# Patient Record
Sex: Female | Born: 1980 | Race: White | Hispanic: No | Marital: Married | Smoking: Current every day smoker
Health system: Southern US, Community
[De-identification: ages and names within clinical notes are randomized; demographics above are authoritative.]

## PROBLEM LIST (undated history)

## (undated) DIAGNOSIS — IMO0002 Reserved for concepts with insufficient information to code with codable children: Secondary | ICD-10-CM

## (undated) HISTORY — DX: Reserved for concepts with insufficient information to code with codable children: IMO0002

## (undated) HISTORY — PX: TONSILLECTOMY AND ADENOIDECTOMY: SHX28

---

## 1999-10-23 ENCOUNTER — Emergency Department (HOSPITAL_COMMUNITY): Admission: EM | Admit: 1999-10-23 | Discharge: 1999-10-23 | Payer: Self-pay | Admitting: Emergency Medicine

## 1999-10-23 ENCOUNTER — Encounter: Payer: Self-pay | Admitting: Emergency Medicine

## 2000-05-16 DIAGNOSIS — IMO0002 Reserved for concepts with insufficient information to code with codable children: Secondary | ICD-10-CM

## 2000-05-16 DIAGNOSIS — R87619 Unspecified abnormal cytological findings in specimens from cervix uteri: Secondary | ICD-10-CM

## 2000-05-16 HISTORY — DX: Unspecified abnormal cytological findings in specimens from cervix uteri: R87.619

## 2000-05-16 HISTORY — DX: Reserved for concepts with insufficient information to code with codable children: IMO0002

## 2000-05-16 HISTORY — PX: VULVA SURGERY: SHX837

## 2000-05-16 HISTORY — PX: COLPOSCOPY: SHX161

## 2000-09-26 ENCOUNTER — Other Ambulatory Visit: Admission: RE | Admit: 2000-09-26 | Discharge: 2000-09-26 | Payer: Self-pay | Admitting: Obstetrics and Gynecology

## 2000-09-26 ENCOUNTER — Encounter (INDEPENDENT_AMBULATORY_CARE_PROVIDER_SITE_OTHER): Payer: Self-pay | Admitting: Specialist

## 2000-10-20 ENCOUNTER — Other Ambulatory Visit: Admission: RE | Admit: 2000-10-20 | Discharge: 2000-10-20 | Payer: Self-pay | Admitting: Obstetrics and Gynecology

## 2000-10-23 ENCOUNTER — Encounter (INDEPENDENT_AMBULATORY_CARE_PROVIDER_SITE_OTHER): Payer: Self-pay | Admitting: Specialist

## 2000-10-23 ENCOUNTER — Other Ambulatory Visit: Admission: RE | Admit: 2000-10-23 | Discharge: 2000-10-23 | Payer: Self-pay | Admitting: Obstetrics and Gynecology

## 2000-11-29 ENCOUNTER — Ambulatory Visit (HOSPITAL_COMMUNITY): Admission: RE | Admit: 2000-11-29 | Discharge: 2000-11-29 | Payer: Self-pay | Admitting: Obstetrics and Gynecology

## 2000-11-29 ENCOUNTER — Encounter (INDEPENDENT_AMBULATORY_CARE_PROVIDER_SITE_OTHER): Payer: Self-pay

## 2001-03-13 ENCOUNTER — Ambulatory Visit: Admission: RE | Admit: 2001-03-13 | Discharge: 2001-03-13 | Payer: Self-pay | Admitting: Gynecology

## 2001-09-28 ENCOUNTER — Other Ambulatory Visit: Admission: RE | Admit: 2001-09-28 | Discharge: 2001-09-28 | Payer: Self-pay | Admitting: Obstetrics and Gynecology

## 2002-09-30 ENCOUNTER — Other Ambulatory Visit: Admission: RE | Admit: 2002-09-30 | Discharge: 2002-09-30 | Payer: Self-pay | Admitting: Obstetrics and Gynecology

## 2003-09-19 ENCOUNTER — Other Ambulatory Visit: Admission: RE | Admit: 2003-09-19 | Discharge: 2003-09-19 | Payer: Self-pay | Admitting: Obstetrics and Gynecology

## 2004-11-12 ENCOUNTER — Other Ambulatory Visit: Admission: RE | Admit: 2004-11-12 | Discharge: 2004-11-12 | Payer: Self-pay | Admitting: Obstetrics and Gynecology

## 2004-11-15 ENCOUNTER — Other Ambulatory Visit: Admission: RE | Admit: 2004-11-15 | Discharge: 2004-11-15 | Payer: Self-pay | Admitting: Obstetrics and Gynecology

## 2005-04-04 ENCOUNTER — Other Ambulatory Visit: Admission: RE | Admit: 2005-04-04 | Discharge: 2005-04-04 | Payer: Self-pay | Admitting: Obstetrics and Gynecology

## 2005-04-05 ENCOUNTER — Other Ambulatory Visit: Admission: RE | Admit: 2005-04-05 | Discharge: 2005-04-05 | Payer: Self-pay | Admitting: Obstetrics and Gynecology

## 2005-11-10 ENCOUNTER — Inpatient Hospital Stay (HOSPITAL_COMMUNITY): Admission: AD | Admit: 2005-11-10 | Discharge: 2005-11-12 | Payer: Self-pay | Admitting: Obstetrics and Gynecology

## 2008-08-09 ENCOUNTER — Inpatient Hospital Stay (HOSPITAL_COMMUNITY): Admission: AD | Admit: 2008-08-09 | Discharge: 2008-08-11 | Payer: Self-pay | Admitting: Obstetrics & Gynecology

## 2008-08-09 ENCOUNTER — Inpatient Hospital Stay (HOSPITAL_COMMUNITY): Admission: AD | Admit: 2008-08-09 | Discharge: 2008-08-09 | Payer: Self-pay | Admitting: Obstetrics & Gynecology

## 2008-09-02 ENCOUNTER — Ambulatory Visit (HOSPITAL_COMMUNITY): Admission: RE | Admit: 2008-09-02 | Discharge: 2008-09-02 | Payer: Self-pay | Admitting: Obstetrics & Gynecology

## 2010-03-28 IMAGING — US US ABDOMEN COMPLETE
1 series · 14 of 25 positions shown · non-contrast
Comparison: None

CLINICAL DATA: Right upper quadrant pain.  3 weeks postpartum.

ABDOMINAL ULTRASOUND COMPLETE

[Series 1: us abdomen complete · 0.35mm/px · 14 of 58 slices shown]
[im 1/58]
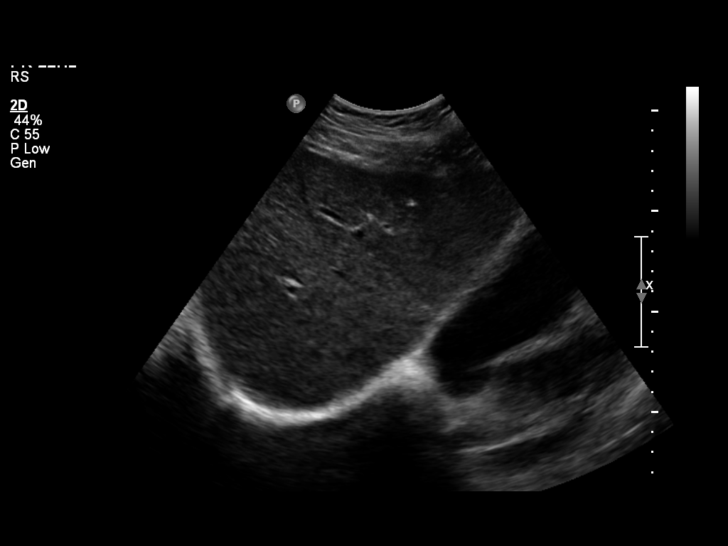
[im 5/58]
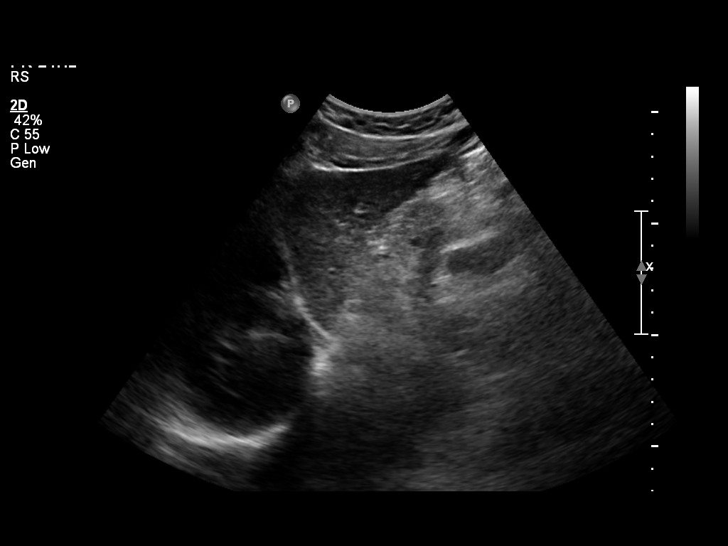
[im 10/58]
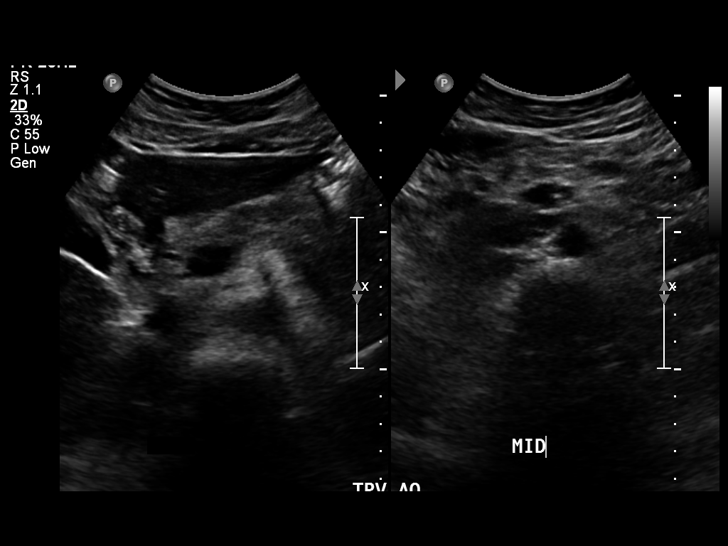
[im 15/58]
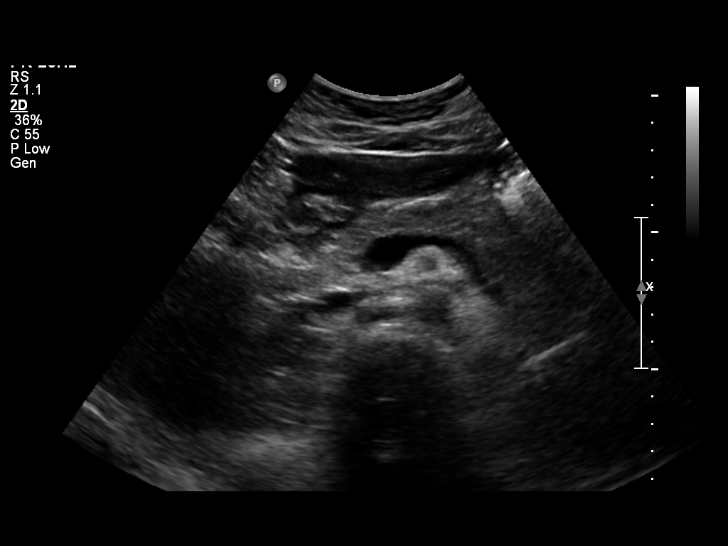
[im 20/58]
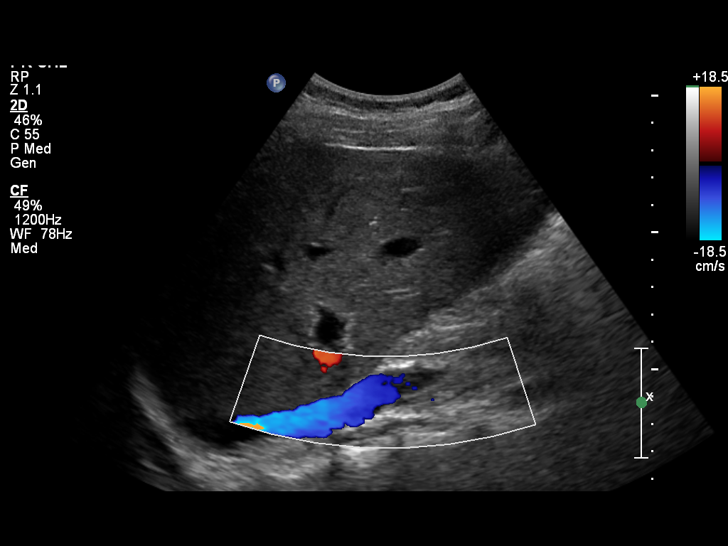
[im 22/58]
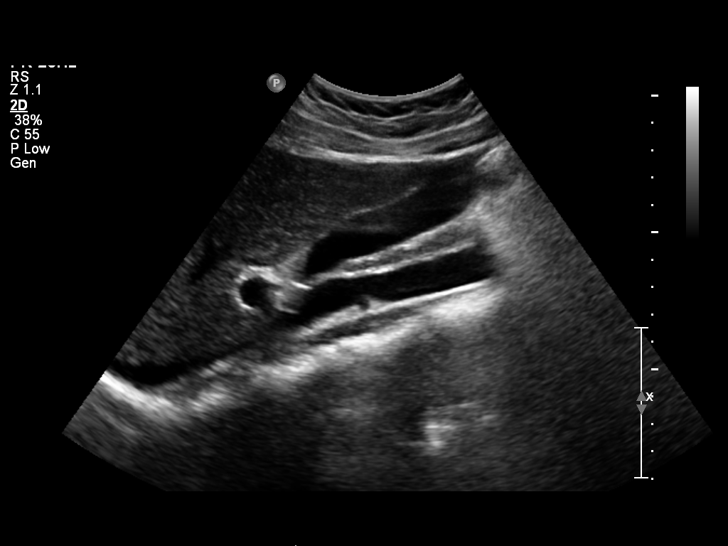
[im 27/58]
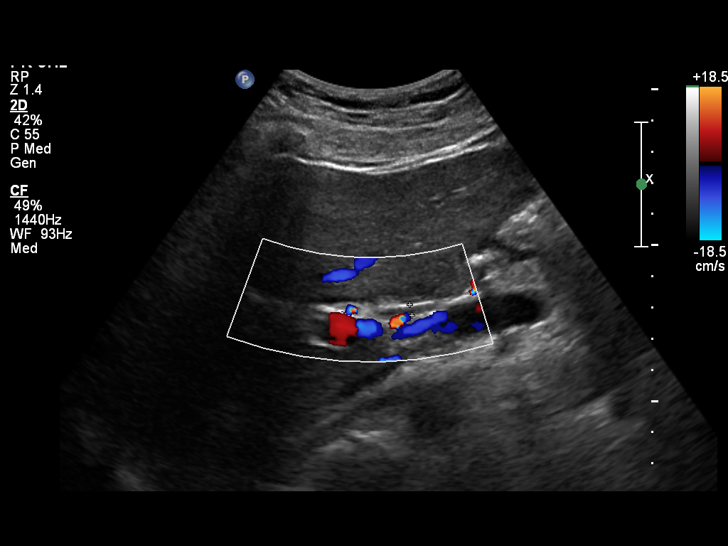
[im 31/58]
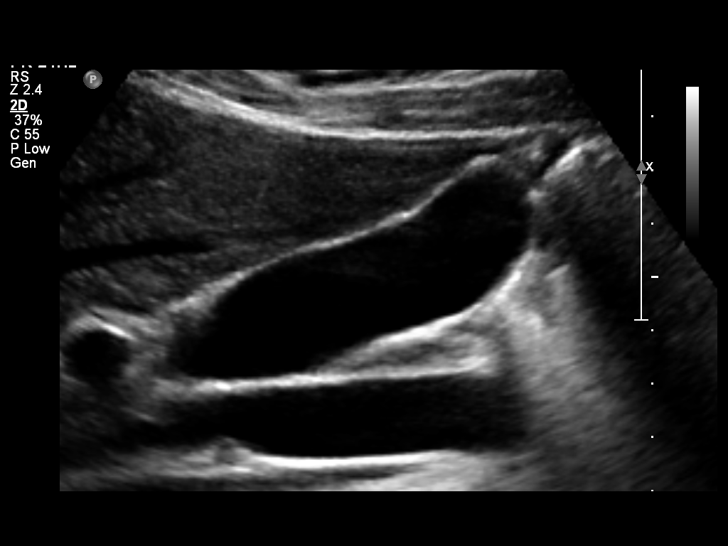
[im 36/58]
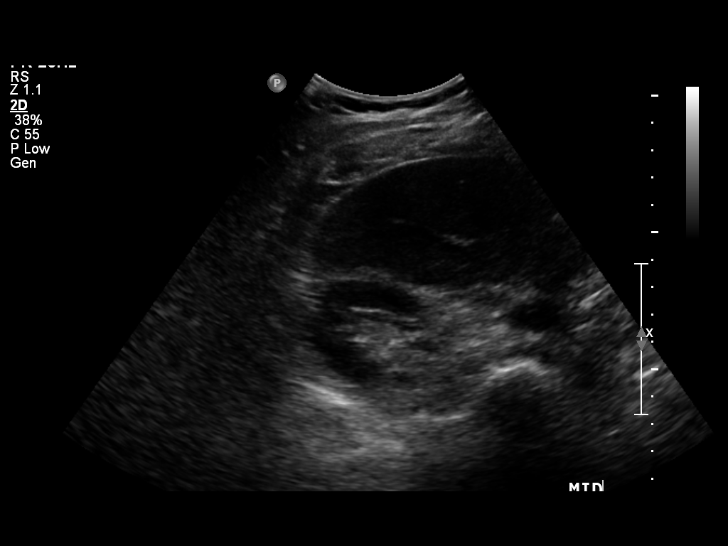
[im 39/58]
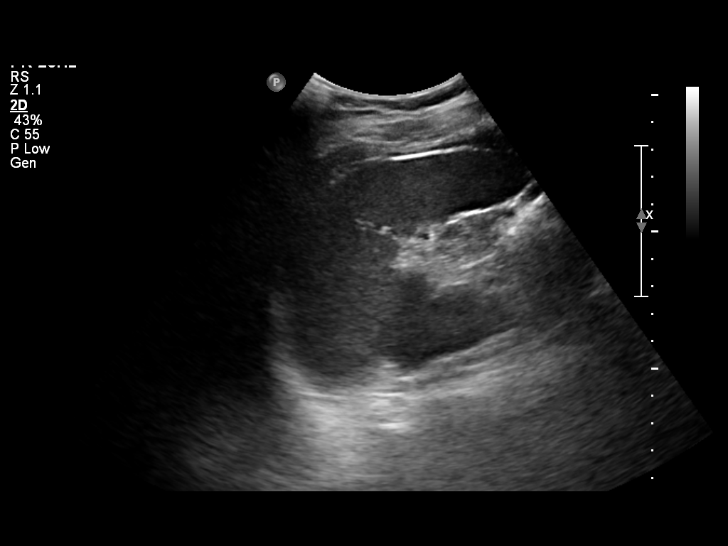
[im 43/58]
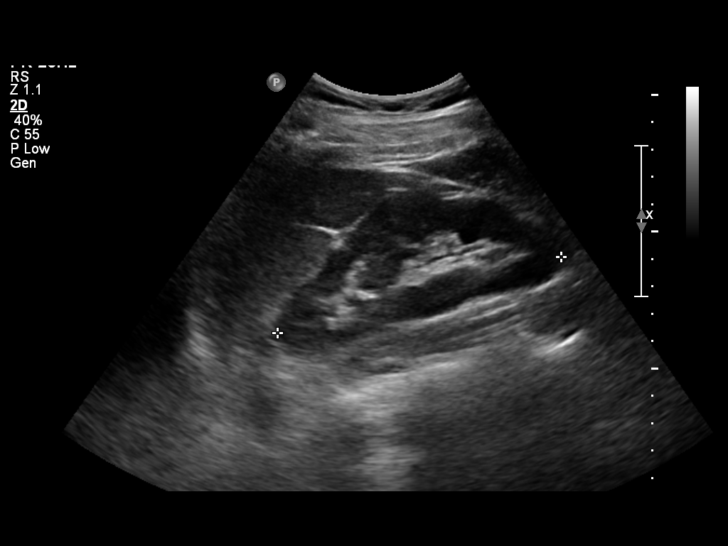
[im 48/58]
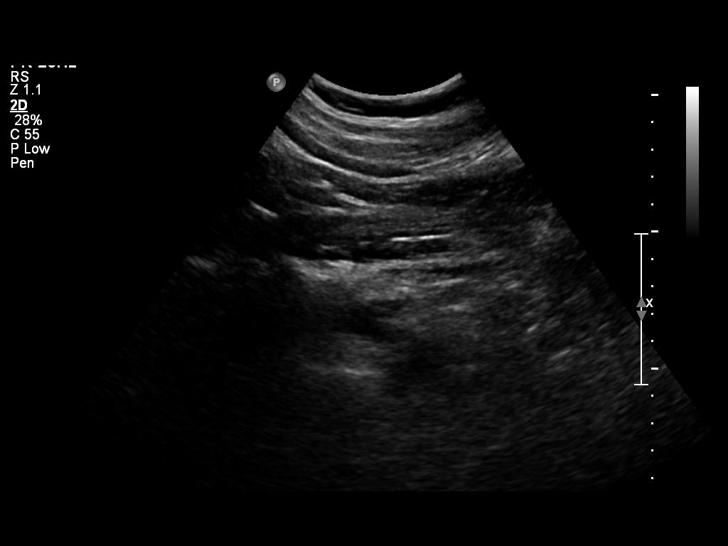
[im 53/58]
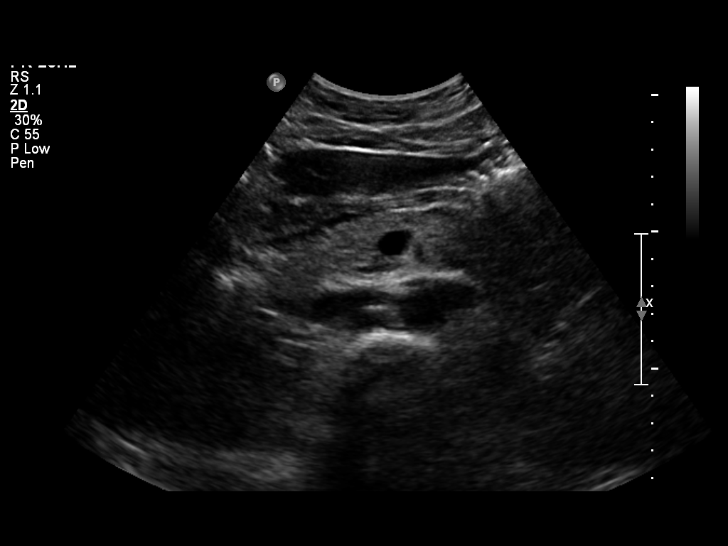
[im 58/58]
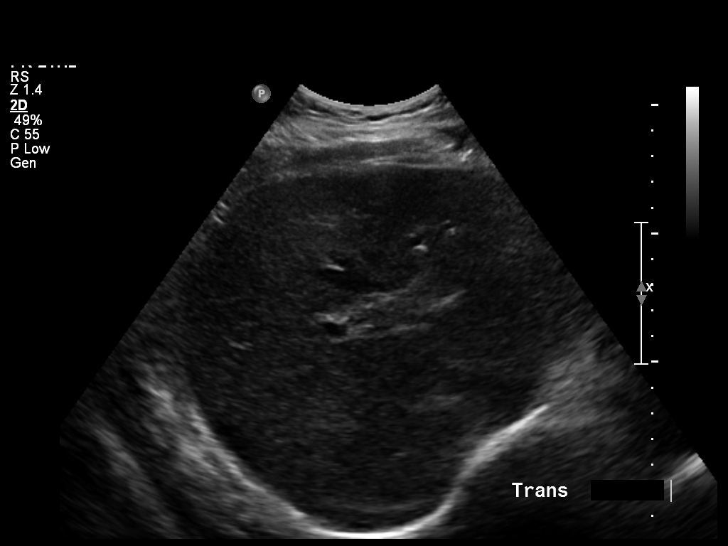

[14 of 25 positions shown; findings below may reference images not displayed]

FINDINGS: Gallbladder:  No gallstones, gallbladder wall thickening, or
pericholecystic fluid.

Common bile duct: Within normal limits in caliber.

Liver:  No focal lesion identified.  Within normal limits in
parenchymal echogenicity.

Inferior vena cava:  Intrahepatic portion appears normal.

Pancreas:  Although entire pancreas not visualized due to bowel
gas, no abnormality identified.

Spleen:  Within normal limits in size and echogenicity.

Right kidney:  Normal in size and parenchymal echogenicity.  No
evidence of mass or hydronephrosis.

Left kidney:  Normal in size and parenchymal echogenicity.   No
evidence of mass or hydronephrosis.

Abdominal aorta:  No aneurysm identified.
IMPRESSION: Negative abdominal ultrasound.  No evidence of gallstones,
hydronephrosis, or other significant abnormality.

## 2010-08-26 LAB — CBC
HCT: 36.7 % (ref 36.0–46.0)
HCT: 37.7 % (ref 36.0–46.0)
HCT: 40.8 % (ref 36.0–46.0)
Hemoglobin: 12.6 g/dL (ref 12.0–15.0)
Hemoglobin: 13.8 g/dL (ref 12.0–15.0)
MCHC: 33.5 g/dL (ref 30.0–36.0)
MCHC: 33.7 g/dL (ref 30.0–36.0)
MCHC: 33.9 g/dL (ref 30.0–36.0)
MCV: 96.8 fL (ref 78.0–100.0)
MCV: 97.5 fL (ref 78.0–100.0)
MCV: 97.8 fL (ref 78.0–100.0)
Platelets: 217 10*3/uL (ref 150–400)
Platelets: 234 10*3/uL (ref 150–400)
RBC: 3.77 MIL/uL — ABNORMAL LOW (ref 3.87–5.11)
RBC: 3.85 MIL/uL — ABNORMAL LOW (ref 3.87–5.11)
RBC: 4.21 MIL/uL (ref 3.87–5.11)
RDW: 14.2 % (ref 11.5–15.5)
RDW: 14.8 % (ref 11.5–15.5)
WBC: 21.1 10*3/uL — ABNORMAL HIGH (ref 4.0–10.5)
WBC: 24.3 10*3/uL — ABNORMAL HIGH (ref 4.0–10.5)

## 2010-08-26 LAB — RPR: RPR Ser Ql: NONREACTIVE

## 2010-10-01 NOTE — Consult Note (Signed)
[facility]  Patient:    Sandy Watson, Sandy Watson Visit Number: [id number] MRN: [id number]          Service Type: GON Location: GYN Attending Physician:  Jeannette Corpus Dictated by:   Rande Brunt. Clarke-Pearson, M.D. Proc. Date: 03/13/01 Admit Date:  03/13/2001   CC:         Brook A. Edward Jolly, M.D.  Telford Nab, R.N.   Consultation Report  The patient is a 30 year old single female referred by Dr. Conley Simmonds for consultation regarding management of carcinoma in situ of the vulva.  The patient has had known carcinoma in situ of the vulva back as far as this past spring.  When on examination she was found to have lesions on the right vulva, which on biopsy showed severe squamous dysplasia.  At the same time, the patient had a normal Pap smear.  Subsequently, she underwent excision of vulvar dysplasia on November 29, 2000, from the right labia majora, right labia minora, and left labia minora.  The right labia majora lesion was 1 x 2 cm. 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  The patient saw Dr. Edward Jolly back in followup on February 21, 2001, and the patient was then referred.  The patient is entirely asymptomatic.  PAST MEDICAL HISTORY:  None.  The patient denies any immunosuppressive drugs or any other medical illnesses.  CURRENT MEDICATIONS:  Birth control pills.  PAST SURGICAL HISTORY:  None except for vulvar excision.  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  She comes accompanied by her mother today.  She lives at home.  She does smoke.  REVIEW OF SYSTEMS:  Otherwise negative.  PHYSICAL EXAMINATION:  VITAL SIGNS:  Weight 125 pounds, height 5 feet 3 inches, blood pressure 128/95, pulse 100, respiratory rate 18.  ABDOMEN:  Soft, nontender, no masses, organomegaly, ascites, or hernias noted.  PELVIC:  EGBUS is essentially normal.   There is an area in the perineum which is slightly thickened.  Culposcopic exam:  The vagina is clean, cervix is normal.  Culposcopic examination of the vulva after application of acidic acid shows numerous very small white lesions which are consistent with severe dysplasia.  I do not see anything that looks like an invasive carcinoma.  I do not see any involvement around the anus.  IMPRESSION:  The patient with a past history of DIN III, status post wide excisions.  At this time the patient is asymptomatic.  It is difficult to recommend extensive laser or excisional removal of these small lesions.  I think at this juncture I would recommend the patient be followed with sequential examinations, and make every effort to discontinue smoking, as I believe this will have the biggest impact on her vulvar disease.  In the future, we might consider utilizing Aldera, as some recent reports suggest a modest response rate.  Alternatively, close observation and excision of more obvious lesions in the future would be suggested.  I would be happy to continue to share in alternating visits with Dr. Edward Jolly.  The patient again is encouraged to enter a smoking cessation program. Dictated by:   Rande Brunt. Clarke-Pearson, M.D. Attending Physician:  Jeannette Corpus DD:  03/14/01 TD:  03/14/01 Job: [id number]

## 2010-10-01 NOTE — Op Note (Signed)
[facility]  Patient:    Sandy Watson, Sandy Watson                       MRN: [id number] Proc. Date: 11/29/00 Adm. Date:  [id number] Attending:  Conley Simmonds A                           Operative Report  PREOPERATIVE DIAGNOSIS:       Vulvar intraepithelial neoplasia III.  POSTOPERATIVE DIAGNOSIS:      Vulvar intraepithelial neoplasia III.  PROCEDURE:                    Excision of vulvar dysplasia.  SURGEON:                      Brook A. Edward Jolly, M.D.  ANESTHESIA:                   MAC, local with 1% lidocaine with 1:100,000 of                               epinephrine.  INTRAVENOUS FLUIDS:           1800 cc Ringers lactate.  ESTIMATED BLOOD LOSS:         Minimal.  URINE OUTPUT:                 Not measured.  COMPLICATIONS:                None.  INDICATIONS:                  The patient was a 30 year old, gravida 0, Caucasian female on ______ Tri-Cyclen oral contraceptive therapy, who was referred initially for an atypical appearing condyloma of the vulva. The patient had been seen by her primary care physician, at which time, the condylomas were treated with cryotherapy. The patient was noted to have reoccurrence of the lesion, and she was therefore referred for a gynecological evaluation and treatment. 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 The patient then did undergo colposcopy which was satisfactory. The patients Pap smear was within normal limits, the cervical biopsy at the 12 to 1 oclock position documented squamous metaplasia with microglandular hyperplasia. The patient was educated to her diagnosis and options for care, the decision was made to proceed with sharp excision of the vulvar lesions after the risks, benefits, and alternatives were reviewed with her and her mother.  FINDINGS:                     Examination under anesthesia documented verrucous appearing lesions of the right labia  majora (1 x 2 cm), right labia minora (0.5 cm), and the left labia minora (4 mm).  SPECIMENS:                    The lesions of the right labia majora, right labia minora, and left labia minora were all sent to pathology separately.  DESCRIPTION OF PROCEDURE:     With an IV in place, the patient was taken to the operating room after she was properly identified. The patient received Ancef 1 g intravenously for antibiotic prophylaxis. The patient was placed in the supine position on the operating room table, MAC anesthesia was induced. She was  then placed in the dorsal lithotomy position and the vulva and perineum were sterilely prepped and draped.  Acetic acid was placed over the vulva, and the lesions as noted above were re-identified. The skin was then injected locally with 1% lidocaine with 1:100,000 of epinephrine. The lesions were outlined in an elliptical shape with a marking pen. They were subsequently sharply excised and sent to pathology. The bases of the excision regions were then cauterized with monopolar cautery for excellent hemostasis. Interrupted sutures of 2-0 Vicryl were used to approximate all of the skin edges. At the end of the procedure, hemostasis was excellent, and there was a good cosmetic appearance to the vulva.  The patient was taken out of the dorsal lithotomy position. She was awakened, she was escorted to the recovery room in stable and awaked condition. There were no complications to the procedure. All needle, instrument, and sponge counts were correct. DD:  11/29/00 TD:  11/30/00 Job: [id number]

## 2012-11-28 ENCOUNTER — Encounter: Payer: Self-pay | Admitting: Obstetrics and Gynecology

## 2012-11-28 ENCOUNTER — Ambulatory Visit (INDEPENDENT_AMBULATORY_CARE_PROVIDER_SITE_OTHER): Payer: BC Managed Care – PPO | Admitting: Obstetrics and Gynecology

## 2012-11-28 VITALS — BP 110/70 | HR 82 | Ht 63.5 in | Wt 137.0 lb

## 2012-11-28 DIAGNOSIS — Z01419 Encounter for gynecological examination (general) (routine) without abnormal findings: Secondary | ICD-10-CM

## 2012-11-28 DIAGNOSIS — Z23 Encounter for immunization: Secondary | ICD-10-CM

## 2012-11-28 DIAGNOSIS — Z0000 Encounter for general adult medical examination without abnormal findings: Secondary | ICD-10-CM

## 2012-11-28 LAB — POCT URINALYSIS DIPSTICK
Ketones, UA: NEGATIVE
Leukocytes, UA: NEGATIVE
Protein, UA: NEGATIVE
pH, UA: 5

## 2012-11-28 NOTE — Patient Instructions (Signed)
EXERCISE AND DIET:  We recommended that you start or continue a regular exercise program for good health. Regular exercise means any activity that makes your heart beat faster and makes you sweat.  We recommend exercising at least 30 minutes per day at least 3 days a week, preferably 4 or 5.  We also recommend a diet low in fat and sugar.  Inactivity, poor dietary choices and obesity can cause diabetes, heart attack, stroke, and kidney damage, among others.    ALCOHOL AND SMOKING:  Women should limit their alcohol intake to no more than 7 drinks/beers/glasses of wine (combined, not each!) per week. Moderation of alcohol intake to this level decreases your risk of breast cancer and liver damage. And of course, no recreational drugs are part of a healthy lifestyle.  And absolutely no smoking or even second hand smoke. Most people know smoking can cause heart and lung diseases, but did you know it also contributes to weakening of your bones? Aging of your skin?  Yellowing of your teeth and nails?  CALCIUM AND VITAMIN D:  Adequate intake of calcium and Vitamin D are recommended.  The recommendations for exact amounts of these supplements seem to change often, but generally speaking 600 mg of calcium (either carbonate or citrate) and 800 units of Vitamin D per day seems prudent. Certain women may benefit from higher intake of Vitamin D.  If you are among these women, your doctor will have told you during your visit.    PAP SMEARS:  Pap smears, to check for cervical cancer or precancers,  have traditionally been done yearly, although recent scientific advances have shown that most women can have pap smears less often.  However, every woman still should have a physical exam from her gynecologist every year. It will include a breast check, inspection of the vulva and vagina to check for abnormal growths or skin changes, a visual exam of the cervix, and then an exam to evaluate the size and shape of the uterus and  ovaries.  And after 34 years of age, a rectal exam is indicated to check for rectal cancers. We will also provide age appropriate advice regarding health maintenance, like when you should have certain vaccines, screening for sexually transmitted diseases, bone density testing, colonoscopy, mammograms, etc.   MAMMOGRAMS:  All women over 40 years old should have a yearly mammogram. Many facilities now offer a "3D" mammogram, which may cost around $50 extra out of pocket. If possible,  we recommend you accept the option to have the 3D mammogram performed.  It both reduces the number of women who will be called back for extra views which then turn out to be normal, and it is better than the routine mammogram at detecting truly abnormal areas.    COLONOSCOPY:  Colonoscopy to screen for colon cancer is recommended for all women at age 50.  We know, you hate the idea of the prep.  We agree, BUT, having colon cancer and not knowing it is worse!!  Colon cancer so often starts as a polyp that can be seen and removed at colonscopy, which can quite literally save your life!  And if your first colonoscopy is normal and you have no family history of colon cancer, most women don't have to have it again for 10 years.  Once every ten years, you can do something that may end up saving your life, right?  We will be happy to help you get it scheduled when you are ready.    Be sure to check your insurance coverage so you understand how much it will cost.  It may be covered as a preventative service at no cost, but you should check your particular policy.    Tetanus, Diphtheria, Pertussis (Tdap) Vaccine What You Need to Know WHY GET VACCINATED? Tetanus, diphtheria and pertussis can be very serious diseases, even for adolescents and adults. Tdap vaccine can protect us from these diseases. TETANUS (Lockjaw) causes painful muscle tightening and stiffness, usually all over the body.  It can lead to tightening of muscles in the head  and neck so you can't open your mouth, swallow, or sometimes even breathe. Tetanus kills about 1 out of 5 people who are infected. DIPHTHERIA can cause a thick coating to form in the back of the throat.  It can lead to breathing problems, paralysis, heart failure, and death. PERTUSSIS (Whooping Cough) causes severe coughing spells, which can cause difficulty breathing, vomiting and disturbed sleep.  It can also lead to weight loss, incontinence, and rib fractures. Up to 2 in 100 adolescents and 5 in 100 adults with pertussis are hospitalized or have complications, which could include pneumonia and death. These diseases are caused by bacteria. Diphtheria and pertussis are spread from person to person through coughing or sneezing. Tetanus enters the body through cuts, scratches, or wounds. Before vaccines, the [place] saw as many as 200,000 cases a year of diphtheria and pertussis, and hundreds of cases of tetanus. Since vaccination began, tetanus and diphtheria have dropped by about 99% and pertussis by about 80%. TDAP VACCINE Tdap vaccine can protect adolescents and adults from tetanus, diphtheria, and pertussis. One dose of Tdap is routinely given at age 11 or 12. People who did not get Tdap at that age should get it as soon as possible. Tdap is especially important for health care professionals and anyone having close contact with a baby younger than 12 months. Pregnant women should get a dose of Tdap during every pregnancy, to protect the newborn from pertussis. Infants are most at risk for severe, life-threatening complications from pertussis. A similar vaccine, called Td, protects from tetanus and diphtheria, but not pertussis. A Td booster should be given every 10 years. Tdap may be given as one of these boosters if you have not already gotten a dose. Tdap may also be given after a severe cut or burn to prevent tetanus infection. Your doctor can give you more information. Tdap may safely  be given at the same time as other vaccines. SOME PEOPLE SHOULD NOT GET THIS VACCINE  If you ever had a life-threatening allergic reaction after a dose of any tetanus, diphtheria, or pertussis containing vaccine, OR if you have a severe allergy to any part of this vaccine, you should not get Tdap. Tell your doctor if you have any severe allergies.  If you had a coma, or Rawles or multiple seizures within 7 days after a childhood dose of DTP or DTaP, you should not get Tdap, unless a cause other than the vaccine was found. You can still get Td.  Talk to your doctor if you:  have epilepsy or another nervous system problem,  had severe pain or swelling after any vaccine containing diphtheria, tetanus or pertussis,  ever had Guillain-Barr Syndrome (GBS),  aren't feeling well on the day the shot is scheduled. RISKS OF A VACCINE REACTION With any medicine, including vaccines, there is a chance of side effects. These are usually mild and go away on their own, but serious   reactions are also possible. Brief fainting spells can follow a vaccination, leading to injuries from falling. Sitting or lying down for about 15 minutes can help prevent these. Tell your doctor if you feel dizzy or light-headed, or have vision changes or ringing in the ears. Mild problems following Tdap (Did not interfere with activities)  Pain where the shot was given (about 3 in 4 adolescents or 2 in 3 adults)  Redness or swelling where the shot was given (about 1 person in 5)  Mild fever of at least 100.4F (up to about 1 in 25 adolescents or 1 in 100 adults)  Headache (about 3 or 4 people in 10)  Tiredness (about 1 person in 3 or 4)  Nausea, vomiting, diarrhea, stomach ache (up to 1 in 4 adolescents or 1 in 10 adults)  Chills, body aches, sore joints, rash, swollen glands (uncommon) Moderate problems following Tdap (Interfered with activities, but did not require medical attention)  Pain where the shot was given  (about 1 in 5 adolescents or 1 in 100 adults)  Redness or swelling where the shot was given (up to about 1 in 16 adolescents or 1 in 25 adults)  Fever over 102F (about 1 in 100 adolescents or 1 in 250 adults)  Headache (about 3 in 20 adolescents or 1 in 10 adults)  Nausea, vomiting, diarrhea, stomach ache (up to 1 or 3 people in 100)  Swelling of the entire arm where the shot was given (up to about 3 in 100). Severe problems following Tdap (Unable to perform usual activities, required medical attention)  Swelling, severe pain, bleeding and redness in the arm where the shot was given (rare). A severe allergic reaction could occur after any vaccine (estimated less than 1 in a million doses). WHAT IF THERE IS A SERIOUS REACTION? What should I look for?  Look for anything that concerns you, such as signs of a severe allergic reaction, very high fever, or behavior changes. Signs of a severe allergic reaction can include hives, swelling of the face and throat, difficulty breathing, a fast heartbeat, dizziness, and weakness. These would start a few minutes to a few hours after the vaccination. What should I do?  If you think it is a severe allergic reaction or other emergency that can't wait, call 9-1-1 or get the person to the nearest hospital. Otherwise, call your doctor.  Afterward, the reaction should be reported to the "Vaccine Adverse Event Reporting System" (VAERS). Your doctor might file this report, or you can do it yourself through the VAERS web site at www.vaers.hhs.gov, or by calling 1-800-822-7967. VAERS is only for reporting reactions. They do not give medical advice.  THE NATIONAL VACCINE INJURY COMPENSATION PROGRAM The National Vaccine Injury Compensation Program (VICP) is a federal program that was created to compensate people who may have been injured by certain vaccines. Persons who believe they may have been injured by a vaccine can learn about the program and about filing a  claim by calling 1-800-338-2382 or visiting the VICP website at www.hrsa.gov/vaccinecompensation. HOW CAN I LEARN MORE?  Ask your doctor.  Call your local or state health department.  Contact the Centers for Disease Control and Prevention (CDC):  Call 1-800-232-4636 or visit CDC's website at www.cdc.gov/vaccines. CDC Tdap Vaccine VIS (09/22/11) Document Released: 11/01/2011 Document Revised: 01/25/2012 Document Reviewed: 11/01/2011 ExitCare Patient Information 2014 [employer].  

## 2012-11-28 NOTE — Progress Notes (Signed)
Patient ID: Sandy Watson, female   DOB: 07-10-1980, 32 y.o.   MRN: [id number] 32 y.o.   Married    Caucasian   female   [phone]   here for annual exam.   Menses normal, monthly.  No pain issues.   Patient's last menstrual period was 11/13/2012.          Sexually active: yes  The current method of family planning is vasectomy.    Exercising: cardio and weights(just began recently). Last mammogram:  never Last pap smear: 2012 wnl History of abnormal pap: Yes in 2002 had coloscopy and VIN III, which was treated.  Had VIN III treated with removal and Aldara cream. Smoking: quit smoking cigarettes 05/2012 and began smoking E-cigs. Alcohol: rarely Last colonoscopy: never Last Bone Density:  never Last tetanus shot: never Last cholesterol check: never  Hgb:   12.7             Urine: trace RBCs.  (no symptoms)   Family History  Problem Relation Age of Onset  . Migraines Mother   . Migraines Sister   . Diabetes Maternal Grandfather   . Migraines Sister     There are no active problems to display for this patient.   Past Medical History  Diagnosis Date  . Abnormal Pap smear 2002    HX of VIN III    Past Surgical History  Procedure Laterality Date  . Colposcopy  2002    HX VIN III  . Tonsillectomy and adenoidectomy    . Vulva surgery  2002    d/t abnormal pap: VIN III---Dr. Edward Jolly    Allergies: Review of patient's allergies indicates no known allergies.  Current Outpatient Prescriptions  Medication Sig Dispense Refill  . NAFTIN 2 % CREA Apply 1 application topically daily.       No current facility-administered medications for this visit.    ROS: Pertinent items are noted in HPI.  Social Hx:  Married.   Teacher.  Exam:    BP 110/70  Pulse 82  Ht 5' 3.5" (1.613 m)  Wt 137 lb (62.143 kg)  BMI 23.88 kg/m2  LMP 11/13/2012   Wt Readings from Last 3 Encounters:  11/28/12 137 lb (62.143 kg)     Ht Readings from Last 3 Encounters:  11/28/12 5' 3.5" (1.613 m)     General appearance: alert, cooperative and appears stated age Head: Normocephalic, without obvious abnormality, atraumatic Neck: no adenopathy, supple, symmetrical, trachea midline and thyroid not enlarged, symmetric, no tenderness/mass/nodules Lungs: clear to auscultation bilaterally Breasts: Inspection negative, No nipple retraction or dimpling, No nipple discharge or bleeding, No axillary or supraclavicular adenopathy, Normal to palpation without dominant masses Heart: regular rate and rhythm Abdomen: soft, non-tender; bowel sounds normal; no masses,  no organomegaly Extremities: extremities normal, atraumatic, no cyanosis or edema Skin: Skin color, texture, turgor normal. No rashes or lesions Lymph nodes: Cervical, supraclavicular, and axillary nodes normal. No abnormal inguinal nodes palpated Neurologic: Grossly normal   Pelvic: External genitalia:  no lesions.  Right labia minora with small perforation (result of prior VIN III treatment with suture breakdown)              Urethra:  normal appearing urethra with no masses, tenderness or lesions              Bartholins and Skenes: normal                 Vagina: normal appearing vagina with normal color and discharge, no  lesions              Cervix: normal appearance              Pap taken: yes and high risk HPV testing        Bimanual Exam:  Uterus:  uterus is normal size, shape, consistency and nontender                                      Adnexa: normal adnexa in size, nontender and no masses                                        A: normal exam Microscopic hematuria Remote history of abnormal pap and VIN III Former tobacco user.  Using E cigarettes.     P: mammogram age 16 years old pap smear and high risk HPV testing No work up indicated for hematuria at this amount Discussed that E cigarettes also have risks due to the inhalation but congratulated patient on her quitting [place]man[place] term tobacco use. TDap vaccine Patient  will do wellness labs at work with the next opportunity. return annually or prn     An After Visit Summary was printed and given to the patient.

## 2013-03-21 ENCOUNTER — Other Ambulatory Visit: Payer: Self-pay

## 2013-07-23 ENCOUNTER — Telehealth: Payer: Self-pay | Admitting: Obstetrics and Gynecology

## 2013-07-23 NOTE — Telephone Encounter (Signed)
Patient returned call, states she has a hx of dysplasia and requests appointment with Dr. Quincy Simmonds for Thursday or Friday for areas of lumps that she has noticed. Patient verbally hesitant for further triage, but denies fevers. Scheduled office visit for Friday at 0930 with Dr. Quincy Simmonds. Patient agreeable.   Routing to provider for final review. Patient agreeable to disposition. Will close encounter

## 2013-07-23 NOTE — Telephone Encounter (Signed)
Patient said she was returning a call but doesnt know who called her ( tracy said it may have been amy)

## 2013-07-23 NOTE — Telephone Encounter (Signed)
Pt says she is having some areas down there that she is concerned about and would like an appointment with dr Quincy Simmonds.

## 2013-07-23 NOTE — Telephone Encounter (Signed)
LMTCB

## 2013-07-26 ENCOUNTER — Ambulatory Visit (INDEPENDENT_AMBULATORY_CARE_PROVIDER_SITE_OTHER): Payer: BC Managed Care – PPO | Admitting: Obstetrics and Gynecology

## 2013-07-26 ENCOUNTER — Encounter: Payer: Self-pay | Admitting: Obstetrics and Gynecology

## 2013-07-26 VITALS — BP 110/72 | HR 82 | Ht 63.5 in | Wt 139.0 lb

## 2013-07-26 DIAGNOSIS — N9089 Other specified noninflammatory disorders of vulva and perineum: Secondary | ICD-10-CM

## 2013-07-26 DIAGNOSIS — D229 Melanocytic nevi, unspecified: Secondary | ICD-10-CM

## 2013-07-26 DIAGNOSIS — L918 Other hypertrophic disorders of the skin: Secondary | ICD-10-CM

## 2013-07-26 DIAGNOSIS — D239 Other benign neoplasm of skin, unspecified: Secondary | ICD-10-CM

## 2013-07-26 DIAGNOSIS — L919 Hypertrophic disorder of the skin, unspecified: Secondary | ICD-10-CM

## 2013-07-26 DIAGNOSIS — L909 Atrophic disorder of skin, unspecified: Secondary | ICD-10-CM

## 2013-07-26 NOTE — Progress Notes (Signed)
Patient ID: Sandy Watson, female   DOB: Jul 21, 1980, 33 y.o.   MRN: [id number] GYNECOLOGY VISIT  PCP:   None  Referring provider:   HPI: 33 y.o.   Married  Caucasian  female   [phone] with Patient's last menstrual period was 07/12/2013.   here for "bump" on right vulva. Not painful. Feels like it comes and goes.  Had HSV testing and this was negative.   Hgb:    Not done Urine:  Not done  GYNECOLOGIC HISTORY: Patient's last menstrual period was 07/12/2013. 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   OB History   Grav Para Term Preterm Abortions TAB SAB Ect Mult Living   4 2 2  2 2    2        There are no active problems to display for this patient.   Past Medical History  Diagnosis Date  . Abnormal Pap smear 2002    HX of VIN III    Past Surgical History  Procedure Laterality Date  . Colposcopy  2002    HX VIN III  . Tonsillectomy and adenoidectomy    . Vulva surgery  2002    d/t abnormal pap: VIN III---Dr. Quincy Simmonds    No current outpatient prescriptions on file.   No current facility-administered medications for this visit.     ALLERGIES: Review of patient's allergies indicates no known allergies.  Family History  Problem Relation Age of Onset  . Migraines Mother   . Migraines Sister   . Diabetes Maternal Grandfather   . Migraines Sister     History   Social History  . Marital Status: Married    Spouse Name: N/A    Number of Children: N/A  . Years of Education: N/A   Occupational History  . Not on  file.   Social History Main Topics  . Smoking status: Former Smoker    Quit date: 05/16/2012  . Smokeless tobacco: Not on file     Comment: smokes E-cigarettes .6 months  . Alcohol Use: Yes     Comment: rarely  . Drug Use: No  . Sexual Activity: Yes    Partners: Male    Birth Control/ Protection: Other-see comments     Comment: vasectomy   Other Topics Concern  . Not on file   Social History Narrative  . No narrative on file    ROS:  Pertinent items are noted in HPI.  PHYSICAL EXAMINATION:    BP 110/72  Pulse 82  Ht 5' 3.5" (1.613 m)  Wt 139 lb (63.05 kg)  BMI 24.23 kg/m2  LMP 07/12/2013   Wt Readings from Last 3 Encounters:  07/26/13 139 lb (63.05 kg)  11/28/12 137 lb (62.143 kg)     Ht Readings from Last 3 Encounters:  07/26/13 5' 3.5" (1.613 m)  11/28/12 5' 3.5" (1.613 m)    General appearance: alert, cooperative and appears stated age   Pelvic: External genitalia:  Lateral to left labia  minora is a thickened area of epithelium.  Left labia minora with hole in the skin (Opening that almost looks like a piercing but was due to surgical change of the skin.)  Right medial thigh with 3 - 4 mm skin tag - unpigmented.  Left medial thigh with 3 - 4 mm pigmented flat nevus.  Hymen inspected in the area of concern to patient - normal folds noted.                  ASSESSMENT  Pigmented nevus. Thickening of the right vulva.  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 Skin tag.  PLAN  Return for vulvar biopsies.   Procedure explained to patient.    An After Visit Summary was printed and given to the patient.  15 minutes face to face time of which over 50% was spent in counseling.

## 2013-07-26 NOTE — Patient Instructions (Signed)
We will call to schedule your vulvar biopsies after we get approval from your insurance company.

## 2013-07-31 ENCOUNTER — Telehealth: Payer: Self-pay | Admitting: Obstetrics and Gynecology

## 2013-07-31 NOTE — Telephone Encounter (Signed)
Left message for patient to call back. Need to review benefits quoted

## 2013-07-31 NOTE — Telephone Encounter (Signed)
Left message for patient to call back  

## 2013-07-31 NOTE — Telephone Encounter (Signed)
Pt returning call

## 2013-08-05 ENCOUNTER — Telehealth: Payer: Self-pay | Admitting: Obstetrics and Gynecology

## 2013-08-05 NOTE — Telephone Encounter (Signed)
Spoke with patient. Advised of $35 copay quoted buy insurance company for vulvar biopsy. Scheduled procedure

## 2013-08-28 ENCOUNTER — Encounter: Payer: Self-pay | Admitting: Obstetrics and Gynecology

## 2013-08-30 ENCOUNTER — Encounter: Payer: Self-pay | Admitting: Obstetrics and Gynecology

## 2013-08-30 ENCOUNTER — Ambulatory Visit (INDEPENDENT_AMBULATORY_CARE_PROVIDER_SITE_OTHER): Payer: BC Managed Care – PPO | Admitting: Obstetrics and Gynecology

## 2013-08-30 VITALS — BP 120/78 | HR 100 | Ht 63.5 in | Wt 132.0 lb

## 2013-08-30 DIAGNOSIS — D229 Melanocytic nevi, unspecified: Secondary | ICD-10-CM

## 2013-08-30 DIAGNOSIS — D239 Other benign neoplasm of skin, unspecified: Secondary | ICD-10-CM

## 2013-08-30 DIAGNOSIS — N9089 Other specified noninflammatory disorders of vulva and perineum: Secondary | ICD-10-CM

## 2013-08-30 DIAGNOSIS — L918 Other hypertrophic disorders of the skin: Secondary | ICD-10-CM

## 2013-08-30 DIAGNOSIS — L909 Atrophic disorder of skin, unspecified: Secondary | ICD-10-CM

## 2013-08-30 DIAGNOSIS — L919 Hypertrophic disorder of the skin, unspecified: Secondary | ICD-10-CM

## 2013-08-30 NOTE — Progress Notes (Signed)
Subjective:     Patient ID: Sandy Watson, female   DOB: 03-25-1981, 33 y.o.   MRN: [id number]  HPI  Patient here for vulvar biopsies and skin tag removal. History of VIN III.  No recent Aldara use. Review of Systems     Objective:   Physical Exam  Genitourinary:      Consent for vulvar biopsies. Sterile prep with betadine. Local 1% lidocaine to areas - total of 4 cc. 3 mm punch used on right superior labia minora thickening and sent to pathology.  AgNO3 applied.  Scalpel used to remove 3 mm skin tag of right medial thigh and sent to pathology. AgNO3 applied.  Bandaid placed. 4 mm punch used of left medial thigh to remove 3 mm flat pigmented nevus and sent to pathology. Single suture of 3/0 vicryl placed.  Bandaid placed. Betadine cleansed from skin.  Minimal EBL. No complications.     Assessment:     Thickened labia minora. History of VIN III. Right thigh skin tag. Left thigh pigmented nevus.     Plan:     Will call patient with results. Instructions given.  Return in 10 days if suture is still present.  (Patient states she may choose to remove it on her own and I am OK with this.)

## 2013-08-30 NOTE — Patient Instructions (Signed)

## 2013-09-09 LAB — IPS OTHER TISSUE BIOPSY

## 2013-11-29 ENCOUNTER — Ambulatory Visit: Payer: BC Managed Care – PPO | Admitting: Obstetrics and Gynecology

## 2013-12-13 ENCOUNTER — Ambulatory Visit: Payer: BC Managed Care – PPO | Admitting: Obstetrics and Gynecology

## 2013-12-25 ENCOUNTER — Ambulatory Visit (INDEPENDENT_AMBULATORY_CARE_PROVIDER_SITE_OTHER): Payer: BC Managed Care – PPO | Admitting: Obstetrics and Gynecology

## 2013-12-25 ENCOUNTER — Encounter: Payer: Self-pay | Admitting: Obstetrics and Gynecology

## 2013-12-25 VITALS — BP 102/68 | HR 72 | Ht 63.5 in | Wt 134.0 lb

## 2013-12-25 DIAGNOSIS — Z720 Tobacco use: Secondary | ICD-10-CM | POA: Insufficient documentation

## 2013-12-25 DIAGNOSIS — Z789 Other specified health status: Secondary | ICD-10-CM

## 2013-12-25 DIAGNOSIS — Z01419 Encounter for gynecological examination (general) (routine) without abnormal findings: Secondary | ICD-10-CM

## 2013-12-25 LAB — LIPID PANEL
Cholesterol: 174 mg/dL (ref 0–200)
HDL: 77 mg/dL (ref >39)
LDL CALC: 84 mg/dL (ref 0–99)
Total CHOL/HDL Ratio: 2.3 Ratio
Triglycerides: 64 mg/dL (ref <150)
VLDL: 13 mg/dL (ref 0–40)

## 2013-12-25 LAB — COMPREHENSIVE METABOLIC PANEL
ALBUMIN: 4.5 g/dL (ref 3.5–5.2)
ALK PHOS: 55 U/L (ref 39–117)
ALT: 14 U/L (ref 0–35)
AST: 19 U/L (ref 0–37)
BUN: 12 mg/dL (ref 6–23)
CHLORIDE: 106 meq/L (ref 96–112)
CO2: 22 mEq/L (ref 19–32)
Calcium: 8.9 mg/dL (ref 8.4–10.5)
Creat: 0.74 mg/dL (ref 0.50–1.10)
GLUCOSE: 87 mg/dL (ref 70–99)
POTASSIUM: 4.5 meq/L (ref 3.5–5.3)
SODIUM: 138 meq/L (ref 135–145)
TOTAL PROTEIN: 6.5 g/dL (ref 6.0–8.3)
Total Bilirubin: 0.3 mg/dL (ref 0.2–1.2)

## 2013-12-25 LAB — POCT URINALYSIS DIPSTICK
Bilirubin, UA: NEGATIVE
Glucose, UA: NEGATIVE
Ketones, UA: NEGATIVE
Leukocytes, UA: NEGATIVE
Nitrite, UA: NEGATIVE
PH UA: 5
PROTEIN UA: NEGATIVE
UROBILINOGEN UA: NEGATIVE

## 2013-12-25 LAB — CBC
HCT: 38.4 % (ref 36.0–46.0)
HEMOGLOBIN: 13.2 g/dL (ref 12.0–15.0)
MCH: 31.2 pg (ref 26.0–34.0)
MCHC: 34.4 g/dL (ref 30.0–36.0)
MCV: 90.8 fL (ref 78.0–100.0)
Platelets: 258 10*3/uL (ref 150–400)
RBC: 4.23 MIL/uL (ref 3.87–5.11)
RDW: 13.9 % (ref 11.5–15.5)
WBC: 7 10*3/uL (ref 4.0–10.5)

## 2013-12-25 LAB — HEMOGLOBIN, FINGERSTICK: Hemoglobin, fingerstick: 13.3 g/dL (ref 12.0–16.0)

## 2013-12-25 NOTE — Progress Notes (Signed)
GYNECOLOGY VISIT  PCP:   Referring provider:   HPI: 33 y.o.   Married  Caucasian  female   [phone] with Patient's last menstrual period was 12/23/2013.   here for  Annual. On menses a little bit.  Having itching and irritation with intercourse and internal ejaculation.  Not used to doing this.  Prior to vasectomy, partner never ejaculated internally in patient.  No other itching or burning otherwise.   No heavy or painful menses.  Menses are short - 4 days.   Hgb:  Provider first Urine:  RBC-trace ph.5.0  GYNECOLOGIC HISTORY: Patient's last menstrual period was 12/23/2013. Sexually active:  yes Partner preference: female Contraception:   vasectomy Menopausal hormone therapy: no DES exposure:   no Blood transfusions:no    Sexually transmitted diseases: no   GYN procedures and prior surgeries:  Vulvar lesion removal Last mammogram: never                 Last pap and high risk HPV testing:  11/28/2012 HR HPV neg  History of abnormal pap smear:  Yes in 2002 had coloscopy and VIN III, which was treated. Had VIN III treated with removal and Aldara cream. No treatment of cervical dysplasia treatment.    OB History   Grav Para Term Preterm Abortions TAB SAB Ect Mult Living   4 2 2  2 2    2        LIFESTYLE: Exercise:  no             Tobacco: e-cigs, quit smoking cigarettes 05/2012 Alcohol:rarely Drug use:  no  OTHER HEALTH MAINTENANCE: Tetanus/TDap: 11/28/12 Gardisil:no Influenza: no  Zostavax:no  Bone density: no Colonoscopy: no  Cholesterol check: provider  Family History  Problem Relation Age of Onset  . Migraines Mother   . Migraines Sister   . Diabetes Maternal Grandfather   . Migraines Sister     There are no active problems to display for this patient.  Past Medical History  Diagnosis Date  . Abnormal Pap smear 2002    HX of VIN III    Past Surgical History  Procedure Laterality Date  . Colposcopy  2002    HX VIN III  . Tonsillectomy and  adenoidectomy    . Vulva surgery  2002    d/t abnormal pap: VIN III---Dr. Quincy Simmonds    ALLERGIES: Hydrocodone  No current outpatient prescriptions on file.   No current facility-administered medications for this visit.     ROS:  Pertinent items are noted in HPI.  History   Social History  . Marital Status: Married    Spouse Name: N/A    Number of Children: N/A  . Years of Education: N/A   Occupational History  . Not on file.   Social History Main Topics  . Smoking status: Former Smoker    Quit date: 05/16/2012  . Smokeless tobacco: Not on file     Comment: smokes E-cigarettes .6 months  . Alcohol Use: Yes     Comment: rarely  . Drug Use: No  . Sexual Activity: Yes    Partners: Male    Birth Control/ Protection: Other-see comments     Comment: vasectomy   Other Topics Concern  . Not on file   Social History Narrative  . No narrative on file    PHYSICAL EXAMINATION:    BP 102/68  Pulse 72  Ht 5' 3.5" (1.613 m)  Wt 134 lb (60.782 kg)  BMI 23.36 kg/m2  LMP 12/23/2013  Wt Readings from Last 3 Encounters:  12/25/13 134 lb (60.782 kg)  08/30/13 132 lb (59.875 kg)  07/26/13 139 lb (63.05 kg)     Ht Readings from Last 3 Encounters:  12/25/13 5' 3.5" (1.613 m)  08/30/13 5' 3.5" (1.613 m)  07/26/13 5' 3.5" (1.613 m)    General appearance: alert, cooperative and appears stated age Head: Normocephalic, without obvious abnormality, atraumatic Neck: no adenopathy, supple, symmetrical, trachea midline and thyroid not enlarged, symmetric, no tenderness/mass/nodules Lungs: clear to auscultation bilaterally Breasts: Inspection negative, No nipple retraction or dimpling, No nipple discharge or bleeding, No axillary or supraclavicular adenopathy, Normal to palpation without dominant masses Heart: regular rate and rhythm Abdomen: soft, non-tender; no masses,  no organomegaly Extremities: extremities normal, atraumatic, no cyanosis or edema Skin: Skin color, texture,  turgor normal. No rashes or lesions Lymph nodes: Cervical, supraclavicular, and axillary nodes normal. No abnormal inguinal nodes palpated Neurologic: Grossly normal  Pelvic: External genitalia:  no lesions              Urethra:  normal appearing urethra with no masses, tenderness or lesions              Bartholins and Skenes: normal                 Vagina: normal appearing vagina with normal color and discharge, no lesions              Cervix: normal appearance.  Menstrual blood noted.               Pap and high risk HPV testing done: No..            Bimanual Exam:  Uterus:  uterus is normal size, shape, consistency and nontender                                      Adnexa: normal adnexa in size, nontender and no masses                                      Rectovaginal: no                                     ASSESSMENT  Normal gynecologic exam. Inhaling nicotine - Vaping.  History of VIN.  PLAN  Mammogram recommended yearly starting at age 69. Pap smear and high risk HPV testing as above. Counseled on self breast exam, Calcium and vitamin D intake, exercise. See lab orders yes Discussed nicotine use with vaping. Cessation recommended.   Declines Rx to stop smoking.  Suggested condom use.  Return annually or prn   An After Visit Summary was printed and given to the patient.

## 2013-12-25 NOTE — Addendum Note (Signed)
Addended by: Gerda Diss on: 12/25/2013 01:46 PM   Modules accepted: Orders

## 2013-12-25 NOTE — Patient Instructions (Signed)

## 2014-03-17 ENCOUNTER — Encounter: Payer: Self-pay | Admitting: Obstetrics and Gynecology

## 2014-04-04 ENCOUNTER — Telehealth: Payer: Self-pay | Admitting: Obstetrics and Gynecology

## 2014-04-04 NOTE — Telephone Encounter (Signed)
Left message regarding 12/2014 appointment time change.

## 2014-04-30 ENCOUNTER — Encounter: Payer: Self-pay | Admitting: Obstetrics and Gynecology

## 2015-01-02 ENCOUNTER — Ambulatory Visit (INDEPENDENT_AMBULATORY_CARE_PROVIDER_SITE_OTHER): Payer: BC Managed Care – PPO | Admitting: Obstetrics and Gynecology

## 2015-01-02 ENCOUNTER — Ambulatory Visit: Payer: BC Managed Care – PPO | Admitting: Obstetrics and Gynecology

## 2015-01-02 ENCOUNTER — Encounter: Payer: Self-pay | Admitting: Obstetrics and Gynecology

## 2015-01-02 VITALS — BP 112/78 | HR 84 | Resp 14 | Ht 63.0 in | Wt 142.4 lb

## 2015-01-02 DIAGNOSIS — Z01419 Encounter for gynecological examination (general) (routine) without abnormal findings: Secondary | ICD-10-CM | POA: Diagnosis not present

## 2015-01-02 DIAGNOSIS — F39 Unspecified mood [affective] disorder: Secondary | ICD-10-CM | POA: Diagnosis not present

## 2015-01-02 DIAGNOSIS — Z113 Encounter for screening for infections with a predominantly sexual mode of transmission: Secondary | ICD-10-CM | POA: Diagnosis not present

## 2015-01-02 DIAGNOSIS — R4586 Emotional lability: Secondary | ICD-10-CM

## 2015-01-02 DIAGNOSIS — Z0000 Encounter for general adult medical examination without abnormal findings: Secondary | ICD-10-CM | POA: Diagnosis not present

## 2015-01-02 LAB — POCT URINALYSIS DIPSTICK
Bilirubin, UA: NEGATIVE
GLUCOSE UA: NEGATIVE
KETONES UA: NEGATIVE
Leukocytes, UA: NEGATIVE
Nitrite, UA: NEGATIVE
Protein, UA: NEGATIVE
Urobilinogen, UA: NEGATIVE
pH, UA: 5

## 2015-01-02 NOTE — Progress Notes (Signed)
Patient ID: Sandy Watson, female   DOB: 12/21/1980, 34 y.o.   MRN: [id number] 34 y.o. [name] Married Caucasian female here for annual exam.    Desires GC/CT.  Declines all blood work.   Having periods of anxiety and periods of anger. Wonders if this is hormonal. No depression.  States she is very focused on having things taken care of like keeping her house clean.  Patient is a Pharmacist, hospital.   PCP: None     Patient's last menstrual period was 12/15/2014 (exact date).          Sexually active: Yes.   female The current method of family planning is vasectomy.    Exercising: No.   Smoker:  Yes, patient uses Vapor daily  Health Maintenance: Pap:  11-28-12 Neg:Neg HR HPV History of abnormal Pap:  Yes, 2002 hx colposcopy with VIN III--treated with removal and Aldara MMG:  n/a Colonoscopy:  n/a BMD:   n/a  Result  n/a TDaP:  11-28-12 Screening Labs:  Hb today: 13.8, Urine today: Trace RBCs--asymptomatic   reports that she has been smoking.  She does not have any smokeless tobacco history on file. She reports that she drinks alcohol. She reports that she does not use illicit drugs.  Past Medical History  Diagnosis Date  . Abnormal Pap smear 2002    HX of VIN III    Past Surgical History  Procedure Laterality Date  . Colposcopy  2002    HX VIN III  . Tonsillectomy and adenoidectomy    . Vulva surgery  2002    d/t abnormal pap: VIN III---Dr. Quincy Watson    No current outpatient prescriptions on file.   No current facility-administered medications for this visit.    Family History  Problem Relation Age of Onset  . Migraines Mother   . Migraines Sister   . Bipolar disorder Sister   . Diabetes Maternal Grandfather   . Migraines Sister     ROS:  Pertinent items are noted in HPI.  Otherwise, a comprehensive ROS was negative.  Exam:   BP 112/78 mmHg  Pulse 84  Resp 14  Ht 5\' 3"  (1.6 m)  Wt 142 lb 6.4 oz (64.592 kg)  BMI 25.23 kg/m2  LMP 12/15/2014 (Exact Date)    General  appearance: alert, cooperative and appears stated age Head: Normocephalic, without obvious abnormality, atraumatic Neck: no adenopathy, supple, symmetrical, trachea midline and thyroid normal to inspection and palpation Lungs: clear to auscultation bilaterally Breasts: normal appearance, no masses or tenderness, Inspection negative, No nipple retraction or dimpling, No nipple discharge or bleeding, No axillary or supraclavicular adenopathy Heart: regular rate and rhythm Abdomen: soft, non-tender; bowel sounds normal; no masses,  no organomegaly Extremities: extremities normal, atraumatic, no cyanosis or edema Skin: Skin color, texture, turgor normal. No rashes or lesions Lymph nodes: Cervical, supraclavicular, and axillary nodes normal. No abnormal inguinal nodes palpated Neurologic: Grossly normal  Pelvic: External genitalia:  no lesions              Urethra:  normal appearing urethra with no masses, tenderness or lesions              Bartholins and Skenes: normal                 Vagina: normal appearing vagina with normal color and discharge, no lesions              Cervix: no lesions  Pap taken: Yes.   Bimanual Exam:  Uterus:  normal size, contour, position, consistency, mobility, non-tender              Adnexa: normal adnexa and no mass, fullness, tenderness              Rectovaginal: No..  Confirms.              Anus:  normal sphincter tone, no lesions  Chaperone was present for exam.  Assessment:   Well woman visit with normal exam. Hx abnormal pap and VIN III.  Desire for GC/CT. Mood swings.   Plan: Yearly mammogram recommended after age 69.  Recommended self breast exam.  Pap and HR HPV as above. Discussed Calcium, Vitamin D, regular exercise program including cardiovascular and weight bearing exercise. Labs performed.  Yes.  .   See orders. Refills given on medications.  No..    Follow up annually and prn.   Additional counseling given regarding mood  swings.   I asked patient to keep a calendar to see if this is occurring premenstrual.  We discussed PMDD. She will contact me back if she sees a pattern in this.  We discussed potential SSRIs if this is the case.  I have also given her a card for Sandy Watson, counselor.  After visit summary provided.

## 2015-01-02 NOTE — Patient Instructions (Signed)

## 2015-01-03 LAB — GC/CHLAMYDIA PROBE AMP, URINE
CHLAMYDIA, SWAB/URINE, PCR: NEGATIVE
GC PROBE AMP, URINE: NEGATIVE

## 2015-01-06 LAB — HEMOGLOBIN, FINGERSTICK: Hemoglobin, fingerstick: 13.8 g/dL (ref 12.0–16.0)

## 2015-01-07 LAB — IPS PAP TEST WITH HPV

## 2016-01-22 ENCOUNTER — Ambulatory Visit: Payer: BC Managed Care – PPO | Admitting: Obstetrics and Gynecology

## 2016-03-02 ENCOUNTER — Ambulatory Visit: Payer: BC Managed Care – PPO | Admitting: Obstetrics and Gynecology

## 2016-05-05 NOTE — Progress Notes (Deleted)
35 y.o. [id number] Married Caucasian female here for annual exam.    PCP:     No LMP recorded.           Sexually active: {yes no:314532}  The current method of family planning is vasectomy.    Exercising: {yes no:314532}  {types:19826} Smoker:  {YES NO:22349}  Health Maintenance: Pap: 01-02-15 Neg:Neg HR HPV  History of abnormal Pap:  Yes,2002 hx colposcopy with VIN III--treated with removal and Aldara MMG:  n/a Colonoscopy:  n/a BMD:   n/a  Result  n/a TDaP:  11-28-12 Gardasil:   {YES NO:22349} HIV: Hep C: Screening Labs:  Hb today: ***, Urine today: ***   reports that she has been smoking.  She does not have any smokeless tobacco history on file. She reports that she drinks alcohol. She reports that she does not use drugs.  Past Medical History:  Diagnosis Date  . Abnormal Pap smear 2002   HX of VIN III    Past Surgical History:  Procedure Laterality Date  . COLPOSCOPY  2002   HX VIN III  . TONSILLECTOMY AND ADENOIDECTOMY    . VULVA SURGERY  2002   d/t abnormal pap: VIN III---Dr. Quincy Simmonds    No current outpatient prescriptions on file.   No current facility-administered medications for this visit.     Family History  Problem Relation Age of Onset  . Migraines Mother   . Migraines Sister   . Bipolar disorder Sister   . Diabetes Maternal Grandfather   . Migraines Sister     ROS:  Pertinent items are noted in HPI.  Otherwise, a comprehensive ROS was negative.  Exam:   There were no vitals taken for this visit.    General appearance: alert, cooperative and appears stated age Head: Normocephalic, without obvious abnormality, atraumatic Neck: no adenopathy, supple, symmetrical, trachea midline and thyroid normal to inspection and palpation Lungs: clear to auscultation bilaterally Breasts: normal appearance, no masses or tenderness, No nipple retraction or dimpling, No nipple discharge or bleeding, No axillary or supraclavicular adenopathy Heart: regular rate and  rhythm Abdomen: soft, non-tender; no masses, no organomegaly Extremities: extremities normal, atraumatic, no cyanosis or edema Skin: Skin color, texture, turgor normal. No rashes or lesions Lymph nodes: Cervical, supraclavicular, and axillary nodes normal. No abnormal inguinal nodes palpated Neurologic: Grossly normal  Pelvic: External genitalia:  no lesions              Urethra:  normal appearing urethra with no masses, tenderness or lesions              Bartholins and Skenes: normal                 Vagina: normal appearing vagina with normal color and discharge, no lesions              Cervix: no lesions              Pap taken: {yes no:314532} Bimanual Exam:  Uterus:  normal size, contour, position, consistency, mobility, non-tender              Adnexa: no mass, fullness, tenderness              Rectal exam: {yes no:314532}.  Confirms.              Anus:  normal sphincter tone, no lesions  Chaperone was present for exam.  Assessment:   Well woman visit with normal exam.   Plan: Mammogram screening discussed. Recommended self  breast awareness. Pap and HR HPV as above. Guidelines for Calcium, Vitamin D, regular exercise program including cardiovascular and weight bearing exercise.   Follow up annually and prn.   Additional counseling given.  {yes [id number]. _______ minutes face to face time of which over 50% was spent in counseling.    After visit summary provided.

## 2016-05-06 ENCOUNTER — Telehealth: Payer: Self-pay | Admitting: Obstetrics and Gynecology

## 2016-05-06 ENCOUNTER — Ambulatory Visit: Payer: BC Managed Care – PPO | Admitting: Obstetrics and Gynecology

## 2016-05-06 ENCOUNTER — Encounter: Payer: Self-pay | Admitting: Obstetrics and Gynecology

## 2016-05-06 NOTE — Telephone Encounter (Signed)
Patient called and cancelled her AEX with Dr. Quincy Simmonds for today due to having a flat tire and will be too late to arrive to her appointment.  Patient very apologetic and will call back to reschedule.

## 2016-05-06 NOTE — Telephone Encounter (Signed)
Thank you for the update.  Encounter closed. 

## 2021-11-09 ENCOUNTER — Encounter: Payer: Self-pay | Admitting: Radiology

## 2021-11-09 ENCOUNTER — Other Ambulatory Visit (HOSPITAL_COMMUNITY)
Admission: RE | Admit: 2021-11-09 | Discharge: 2021-11-09 | Disposition: A | Discharge status: Home / Self Care | Payer: BC Managed Care – PPO | Source: Ambulatory Visit | Attending: Radiology | Admitting: Radiology

## 2021-11-09 ENCOUNTER — Ambulatory Visit (INDEPENDENT_AMBULATORY_CARE_PROVIDER_SITE_OTHER): Payer: BC Managed Care – PPO | Admitting: Radiology

## 2021-11-09 VITALS — BP 126/88 | Ht 63.5 in | Wt 183.0 lb

## 2021-11-09 DIAGNOSIS — Z8249 Family history of ischemic heart disease and other diseases of the circulatory system: Secondary | ICD-10-CM | POA: Diagnosis not present

## 2021-11-09 DIAGNOSIS — R635 Abnormal weight gain: Secondary | ICD-10-CM

## 2021-11-09 DIAGNOSIS — Z01419 Encounter for gynecological examination (general) (routine) without abnormal findings: Secondary | ICD-10-CM | POA: Insufficient documentation

## 2021-11-11 ENCOUNTER — Other Ambulatory Visit: Payer: BC Managed Care – PPO

## 2021-11-11 DIAGNOSIS — Z01419 Encounter for gynecological examination (general) (routine) without abnormal findings: Secondary | ICD-10-CM

## 2021-11-11 DIAGNOSIS — Z8249 Family history of ischemic heart disease and other diseases of the circulatory system: Secondary | ICD-10-CM

## 2021-11-11 DIAGNOSIS — R635 Abnormal weight gain: Secondary | ICD-10-CM

## 2021-11-12 LAB — COMPREHENSIVE METABOLIC PANEL
AG Ratio: 1.6 (calc) (ref 1.0–2.5)
ALT: 20 U/L (ref 6–29)
AST: 21 U/L (ref 10–30)
Albumin: 4.4 g/dL (ref 3.6–5.1)
Alkaline phosphatase (APISO): 75 U/L (ref 31–125)
BUN: 11 mg/dL (ref 7–25)
CO2: 24 mmol/L (ref 20–32)
Calcium: 9.2 mg/dL (ref 8.6–10.2)
Chloride: 105 mmol/L (ref 98–110)
Creat: 0.8 mg/dL (ref 0.50–0.99)
Globulin: 2.7 g/dL (calc) (ref 1.9–3.7)
Glucose, Bld: 96 mg/dL (ref 65–99)
Potassium: 4.7 mmol/L (ref 3.5–5.3)
Sodium: 139 mmol/L (ref 135–146)
Total Bilirubin: 0.4 mg/dL (ref 0.2–1.2)
Total Protein: 7.1 g/dL (ref 6.1–8.1)

## 2021-11-12 LAB — CBC
HCT: 40.7 % (ref 35.0–45.0)
Hemoglobin: 13.5 g/dL (ref 11.7–15.5)
MCH: 30.9 pg (ref 27.0–33.0)
MCHC: 33.2 g/dL (ref 32.0–36.0)
MCV: 93.1 fL (ref 80.0–100.0)
MPV: 10.2 fL (ref 7.5–12.5)
Platelets: 324 10*3/uL (ref 140–400)
RBC: 4.37 10*6/uL (ref 3.80–5.10)
RDW: 12.8 % (ref 11.0–15.0)
WBC: 8.3 10*3/uL (ref 3.8–10.8)

## 2021-11-12 LAB — THYROID PANEL WITH TSH
Free Thyroxine Index: 2.4 (ref 1.4–3.8)
T3 Uptake: 27 % (ref 22–35)
T4, Total: 8.9 ug/dL (ref 5.1–11.9)
TSH: 2.94 mIU/L

## 2021-11-12 LAB — LIPID PANEL
Cholesterol: 225 mg/dL — ABNORMAL HIGH (ref <200)
HDL: 82 mg/dL (ref >=50)
LDL Cholesterol (Calc): 120 mg/dL (calc) — ABNORMAL HIGH
Non-HDL Cholesterol (Calc): 143 mg/dL (calc) — ABNORMAL HIGH (ref <130)
Total CHOL/HDL Ratio: 2.7 (calc) (ref <5.0)
Triglycerides: 122 mg/dL (ref <150)

## 2021-11-12 LAB — HEMOGLOBIN A1C
Hgb A1c MFr Bld: 5.2 % of total Hgb (ref <5.7)
Mean Plasma Glucose: 103 mg/dL
eAG (mmol/L): 5.7 mmol/L

## 2021-11-12 LAB — CYTOLOGY - PAP
Comment: NEGATIVE
Diagnosis: UNDETERMINED — AB
High risk HPV: NEGATIVE

## 2022-01-01 ENCOUNTER — Ambulatory Visit (HOSPITAL_BASED_OUTPATIENT_CLINIC_OR_DEPARTMENT_OTHER)
Admission: RE | Admit: 2022-01-01 | Discharge: 2022-01-01 | Disposition: A | Discharge status: Home / Self Care | Payer: BC Managed Care – PPO | Source: Ambulatory Visit | Attending: Diagnostic Radiology | Admitting: Diagnostic Radiology

## 2022-01-01 DIAGNOSIS — Z1231 Encounter for screening mammogram for malignant neoplasm of breast: Secondary | ICD-10-CM | POA: Insufficient documentation
